# Patient Record
Sex: Male | Born: 1965 | Race: White | Hispanic: No | Marital: Married | Smoking: Never smoker
Health system: Southern US, Community
[De-identification: ages and names within clinical notes are randomized; demographics above are authoritative.]

## PROBLEM LIST (undated history)

## (undated) DIAGNOSIS — J45909 Unspecified asthma, uncomplicated: Secondary | ICD-10-CM

## (undated) DIAGNOSIS — I10 Essential (primary) hypertension: Secondary | ICD-10-CM

## (undated) HISTORY — DX: Unspecified asthma, uncomplicated: J45.909

## (undated) HISTORY — DX: Essential (primary) hypertension: I10

---

## 2003-07-20 HISTORY — PX: SINUSOTOMY: SHX291

## 2006-11-25 ENCOUNTER — Encounter: Admission: RE | Admit: 2006-11-25 | Discharge: 2006-11-25 | Payer: Self-pay | Admitting: Gastroenterology

## 2007-01-02 ENCOUNTER — Encounter: Admission: RE | Admit: 2007-01-02 | Discharge: 2007-01-02 | Payer: Self-pay | Admitting: Gastroenterology

## 2007-02-09 ENCOUNTER — Encounter: Admission: RE | Admit: 2007-02-09 | Discharge: 2007-02-09 | Payer: Self-pay | Admitting: Gastroenterology

## 2007-03-28 ENCOUNTER — Encounter (INDEPENDENT_AMBULATORY_CARE_PROVIDER_SITE_OTHER): Payer: Self-pay | Admitting: General Surgery

## 2007-03-28 ENCOUNTER — Ambulatory Visit (HOSPITAL_COMMUNITY): Admission: RE | Admit: 2007-03-28 | Discharge: 2007-03-28 | Payer: Self-pay | Admitting: General Surgery

## 2008-11-17 IMAGING — CT CT ABDOMEN W/ CM
2 of 5 series · 17 of 46 positions shown, 19 images · IV contrast (READICAT/WATER & [ID] OMNI 300)
Comparison: Ultrasound 11/25/06.

CLINICAL DATA: Abdominal pain.   Right upper quadrant pain.  Known gallstones.  Evaluate splenic cyst. 
 ABDOMEN CT WITH CONTRAST:
TECHNIQUE: Multidetector CT imaging of the abdomen was performed following the standard protocol during bolus administration of intravenous contrast.
 Contrast:  100 ml Omnipaque 300 IV
TECHNIQUE: Multidetector CT imaging of the pelvis was performed following the standard protocol during bolus administration of intravenous contrast.
 No comparison.

[Series 3: routine abdomen · axial · 0.68mm/px · z∈[-343,+62]mm · 14 of 87 slices shown, 16 images]
[im 5/87  soft-tissue]
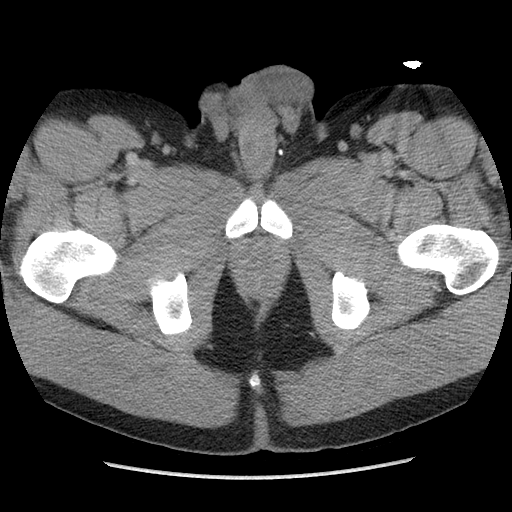
[im 5/87  bone]
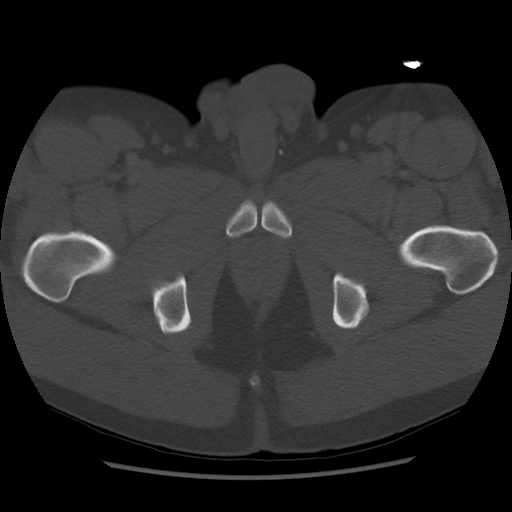
[im 10/87  soft-tissue]
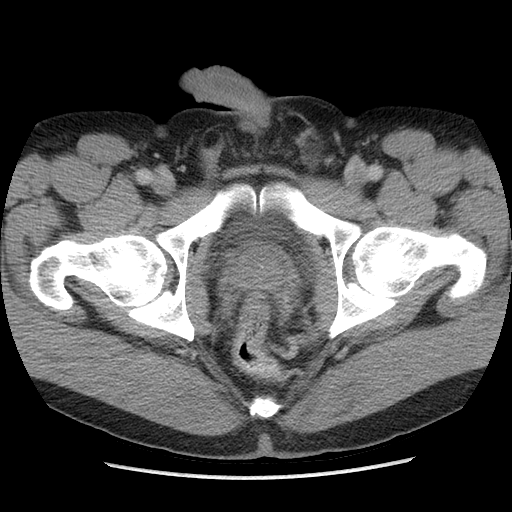
[im 20/87  soft-tissue]
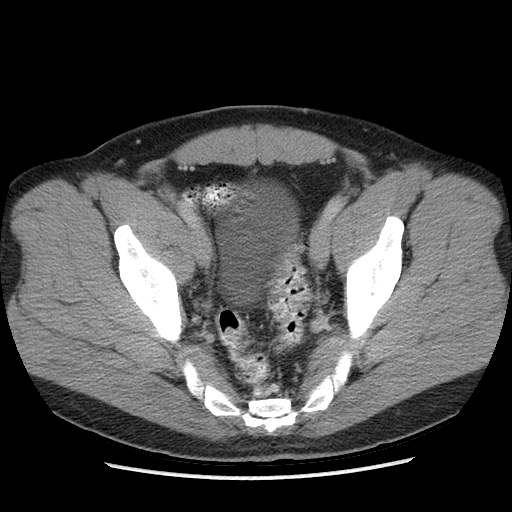
[im 24/87  soft-tissue]
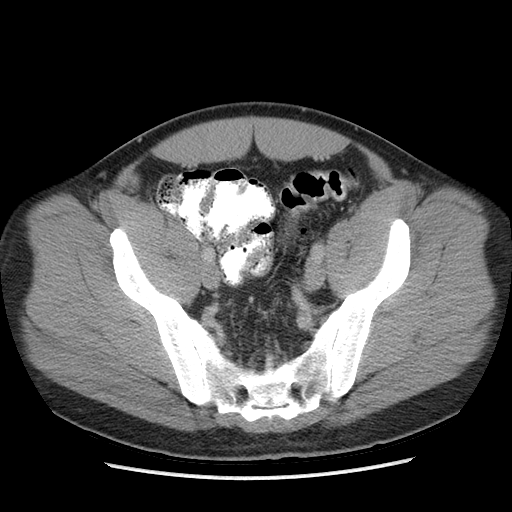
[im 29/87  soft-tissue]
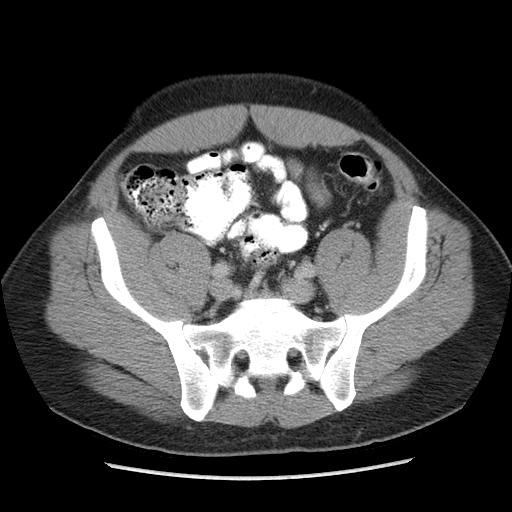
[im 34/87  soft-tissue]
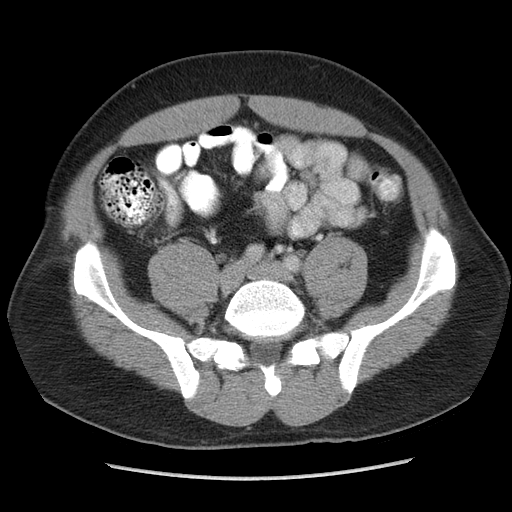
[im 39/87  soft-tissue]
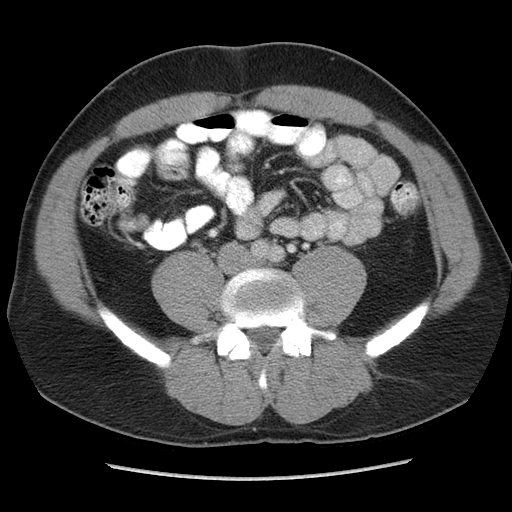
[im 48/87  soft-tissue]
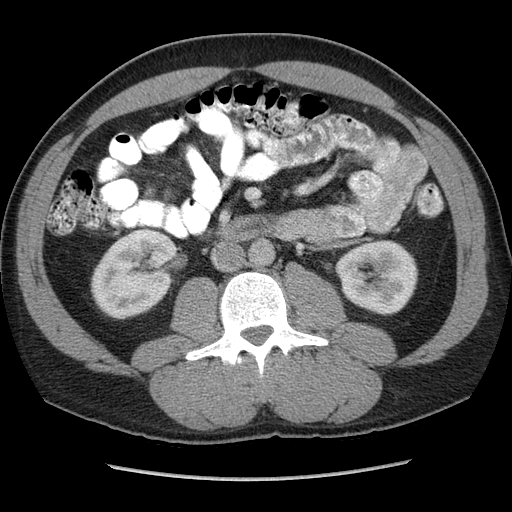
[im 53/87  soft-tissue]
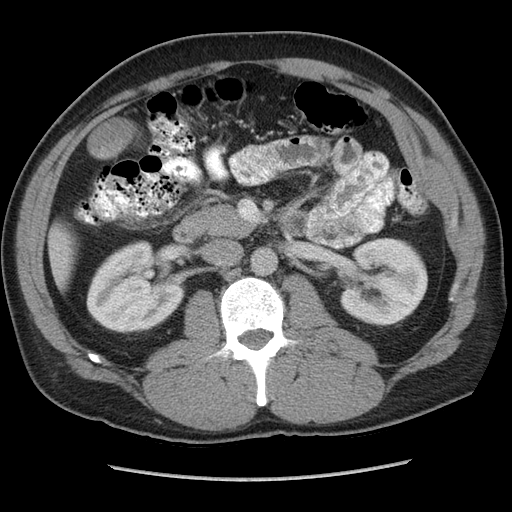
[im 53/87  bone]
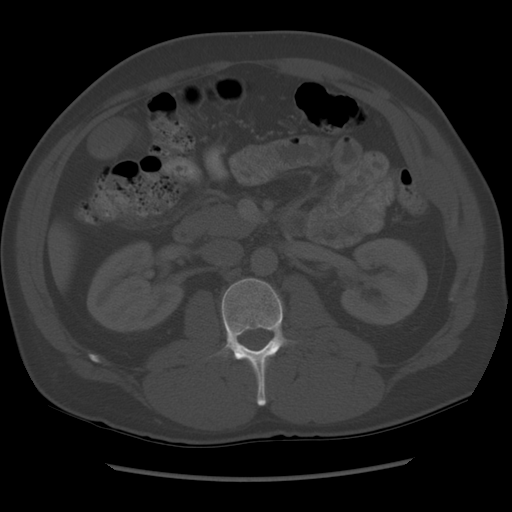
[im 58/87  soft-tissue]
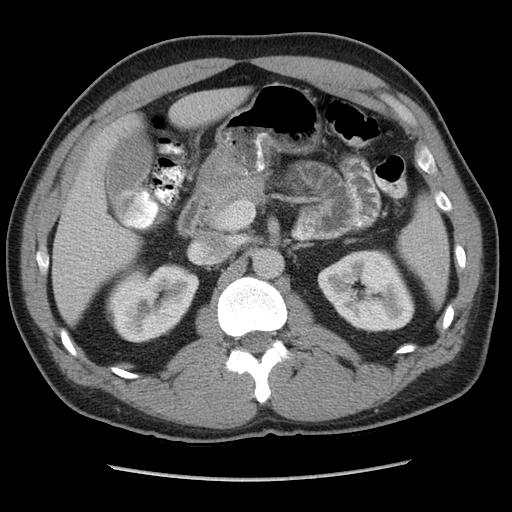
[im 63/87  soft-tissue]
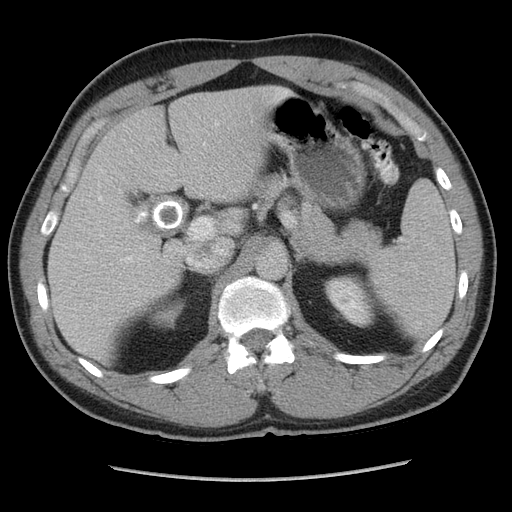
[im 67/87  soft-tissue]
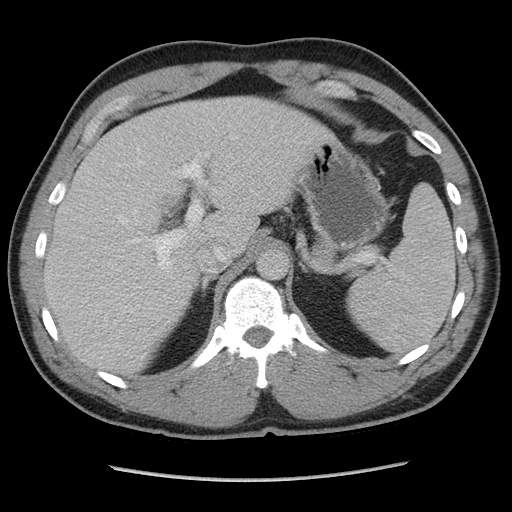
[im 77/87  soft-tissue]
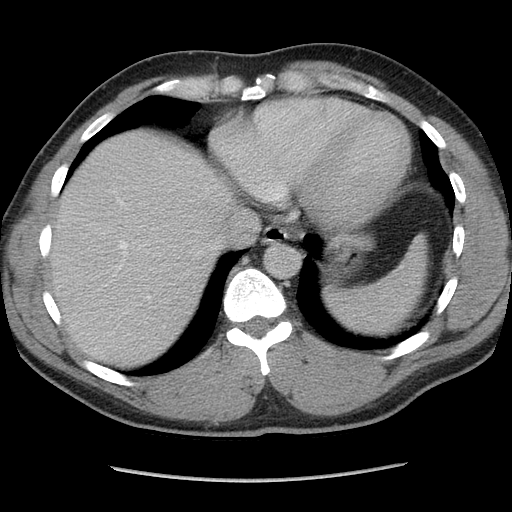
[im 82/87  soft-tissue]
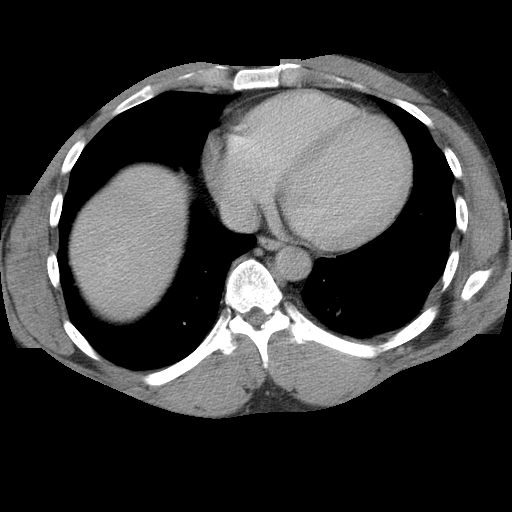

[Series 602: sagittal body · sagittal · 0.92mm/px · 3 of 141 slices shown]
[im 47/141  soft-tissue]
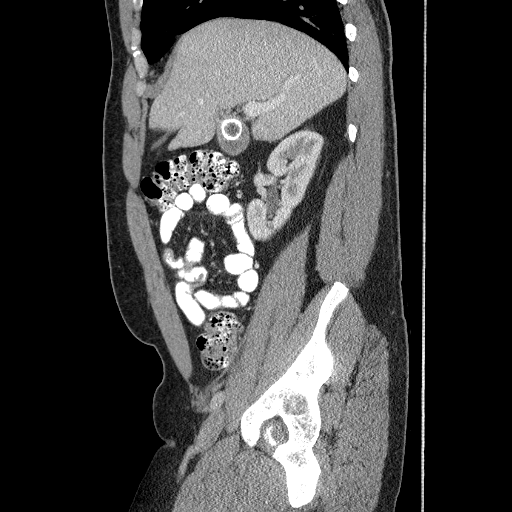
[im 63/141  soft-tissue]
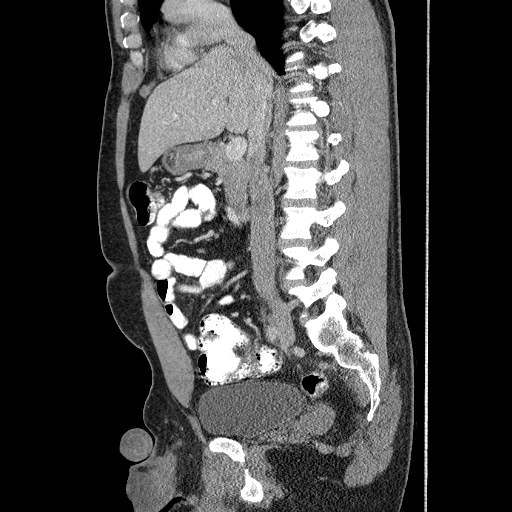
[im 78/141  soft-tissue]
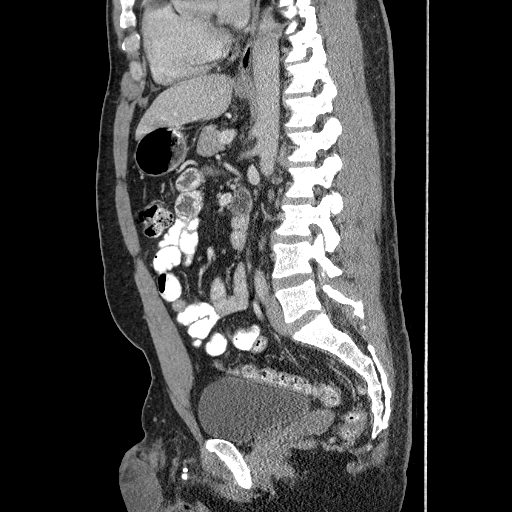

[17 of 46 positions shown; findings below may reference images not displayed]

FINDINGS: There are multiple calcified large gallstones.  The gallbladder wall is not thickened.  There is no biliary dilatation.  No liver lesions are identified.  There is a 13 mm complex cyst in the lower pole of the spleen as noted on an ultrasound.  This has water density, but an irregular wall on CT and ultrasound, and this may be a chronic complex benign appearing cyst.  The pancreas and kidneys are normal.  There is no adenopathy or mass.  The bowel is nondilated.
IMPRESSION: 1.  Multiple calcified large gallstones.
 2.  Benign appearing complex splenic cyst measuring 13 mm.  
 PELVIS CT WITH CONTRAST:
FINDINGS: Sigmoid diverticulosis is noted.  There is mild constipation.  The bowel is nondilated.  There is no free fluid or adenopathy.
IMPRESSION: Sigmoid diverticulosis.   No acute abnormality.

## 2009-01-01 ENCOUNTER — Encounter: Admission: RE | Admit: 2009-01-01 | Discharge: 2009-01-01 | Payer: Self-pay | Admitting: Family Medicine

## 2010-05-08 ENCOUNTER — Ambulatory Visit (HOSPITAL_BASED_OUTPATIENT_CLINIC_OR_DEPARTMENT_OTHER): Admission: RE | Admit: 2010-05-08 | Discharge: 2010-05-08 | Payer: Self-pay | Admitting: General Surgery

## 2010-07-19 HISTORY — PX: CHOLECYSTECTOMY: SHX55

## 2010-09-30 LAB — CBC
MCH: 31.3 pg (ref 26.0–34.0)
MCHC: 35.5 g/dL (ref 30.0–36.0)
MCV: 88.3 fL (ref 78.0–100.0)
Platelets: 238 10*3/uL (ref 150–400)
RBC: 5.04 MIL/uL (ref 4.22–5.81)

## 2010-09-30 LAB — POCT HEMOGLOBIN-HEMACUE: Hemoglobin: 15.5 g/dL (ref 13.0–17.0)

## 2010-09-30 LAB — DIFFERENTIAL
Basophils Relative: 0 % (ref 0–1)
Eosinophils Absolute: 0.1 10*3/uL (ref 0.0–0.7)
Eosinophils Relative: 2 % (ref 0–5)
Lymphs Abs: 2.3 10*3/uL (ref 0.7–4.0)
Monocytes Relative: 7 % (ref 3–12)

## 2010-09-30 LAB — BASIC METABOLIC PANEL
CO2: 30 mEq/L (ref 19–32)
Calcium: 9 mg/dL (ref 8.4–10.5)
Chloride: 103 mEq/L (ref 96–112)
Creatinine, Ser: 1.26 mg/dL (ref 0.4–1.5)
GFR calc Af Amer: >60 mL/min (ref >60)
Glucose, Bld: 100 mg/dL — ABNORMAL HIGH (ref 70–99)

## 2010-10-10 IMAGING — CR DG CHEST 2V
2 series · 2 of 2 positions shown · non-contrast
Comparison: None

CLINICAL DATA: Shortness of breath with exertion.

CHEST - 2 VIEW

[w chest pa]
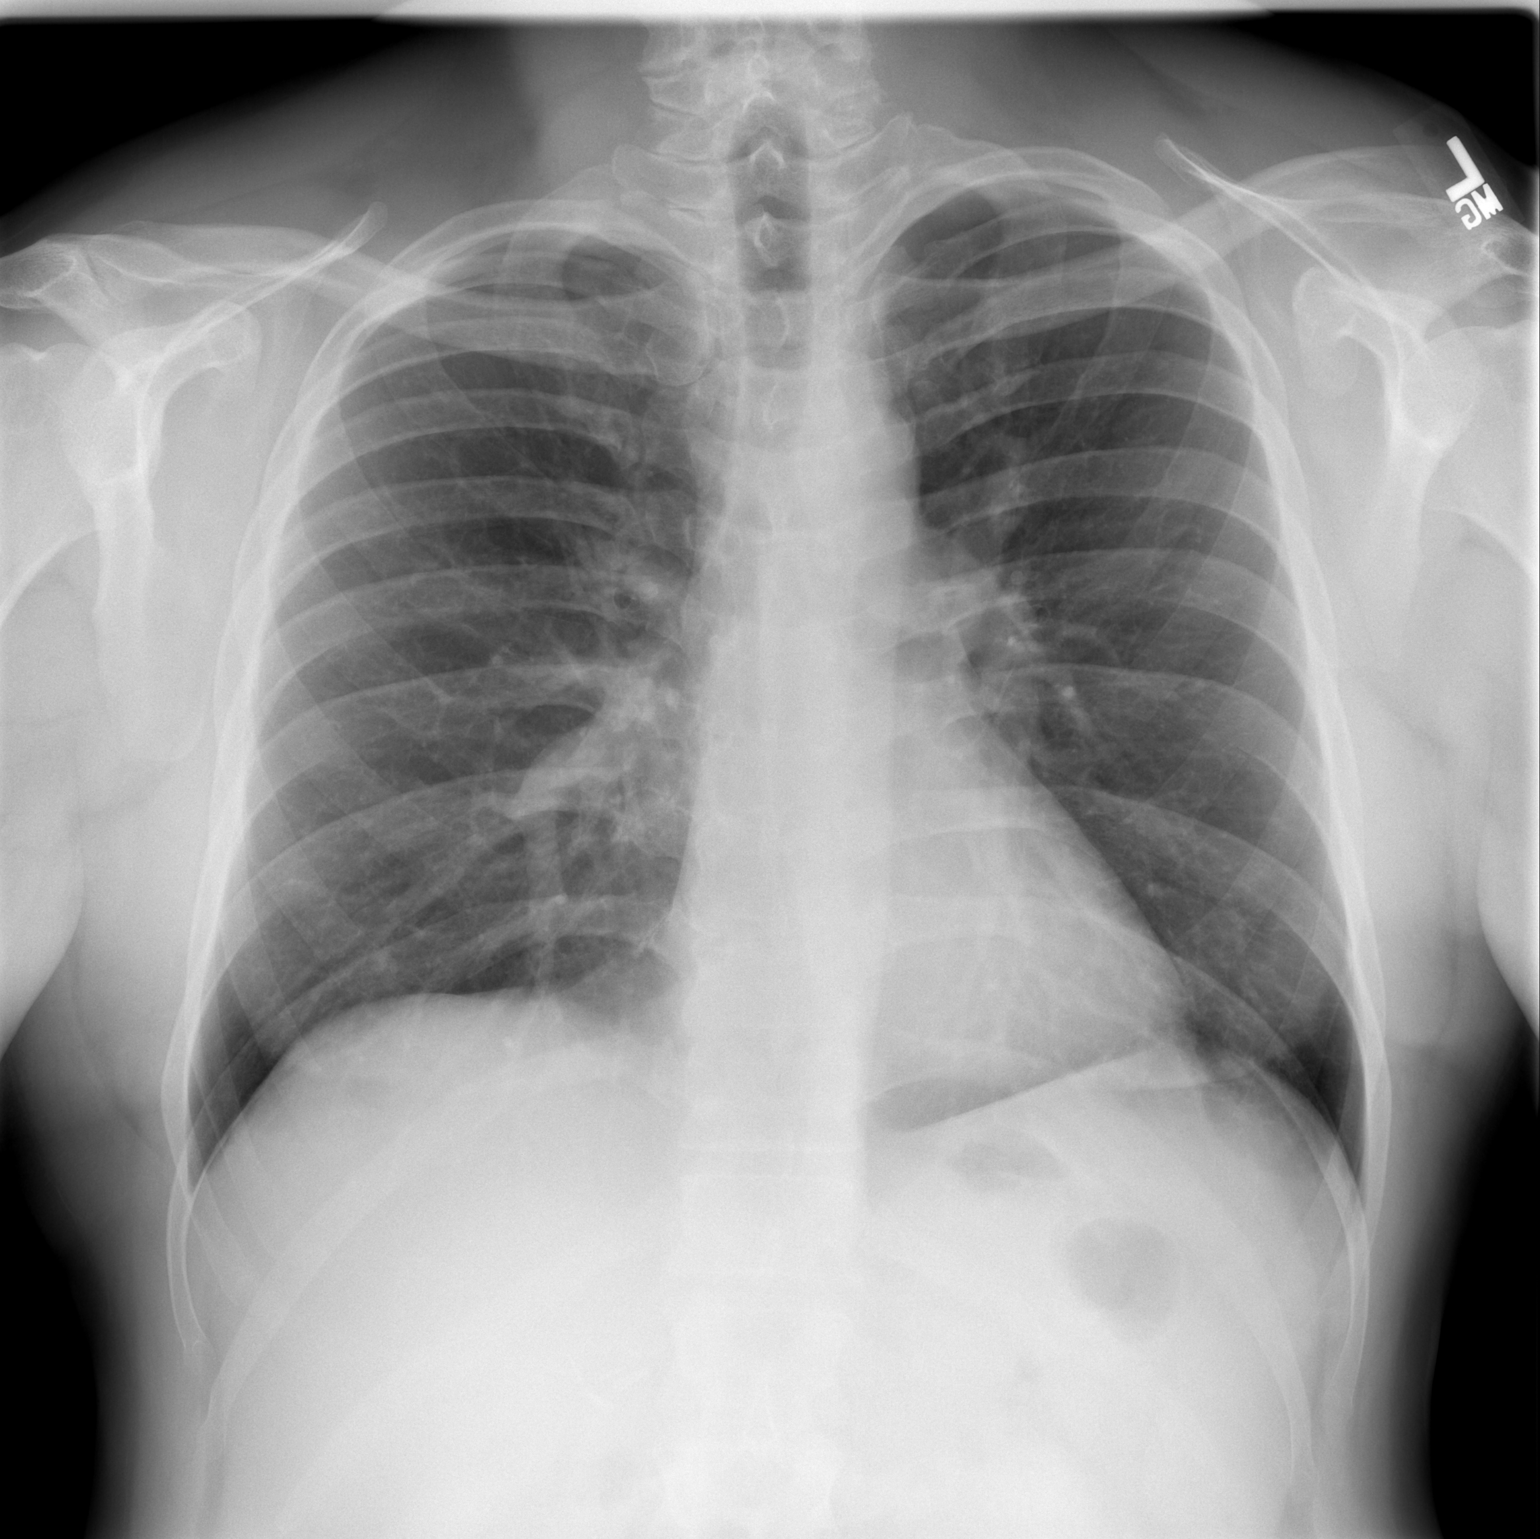

[w chest lat]
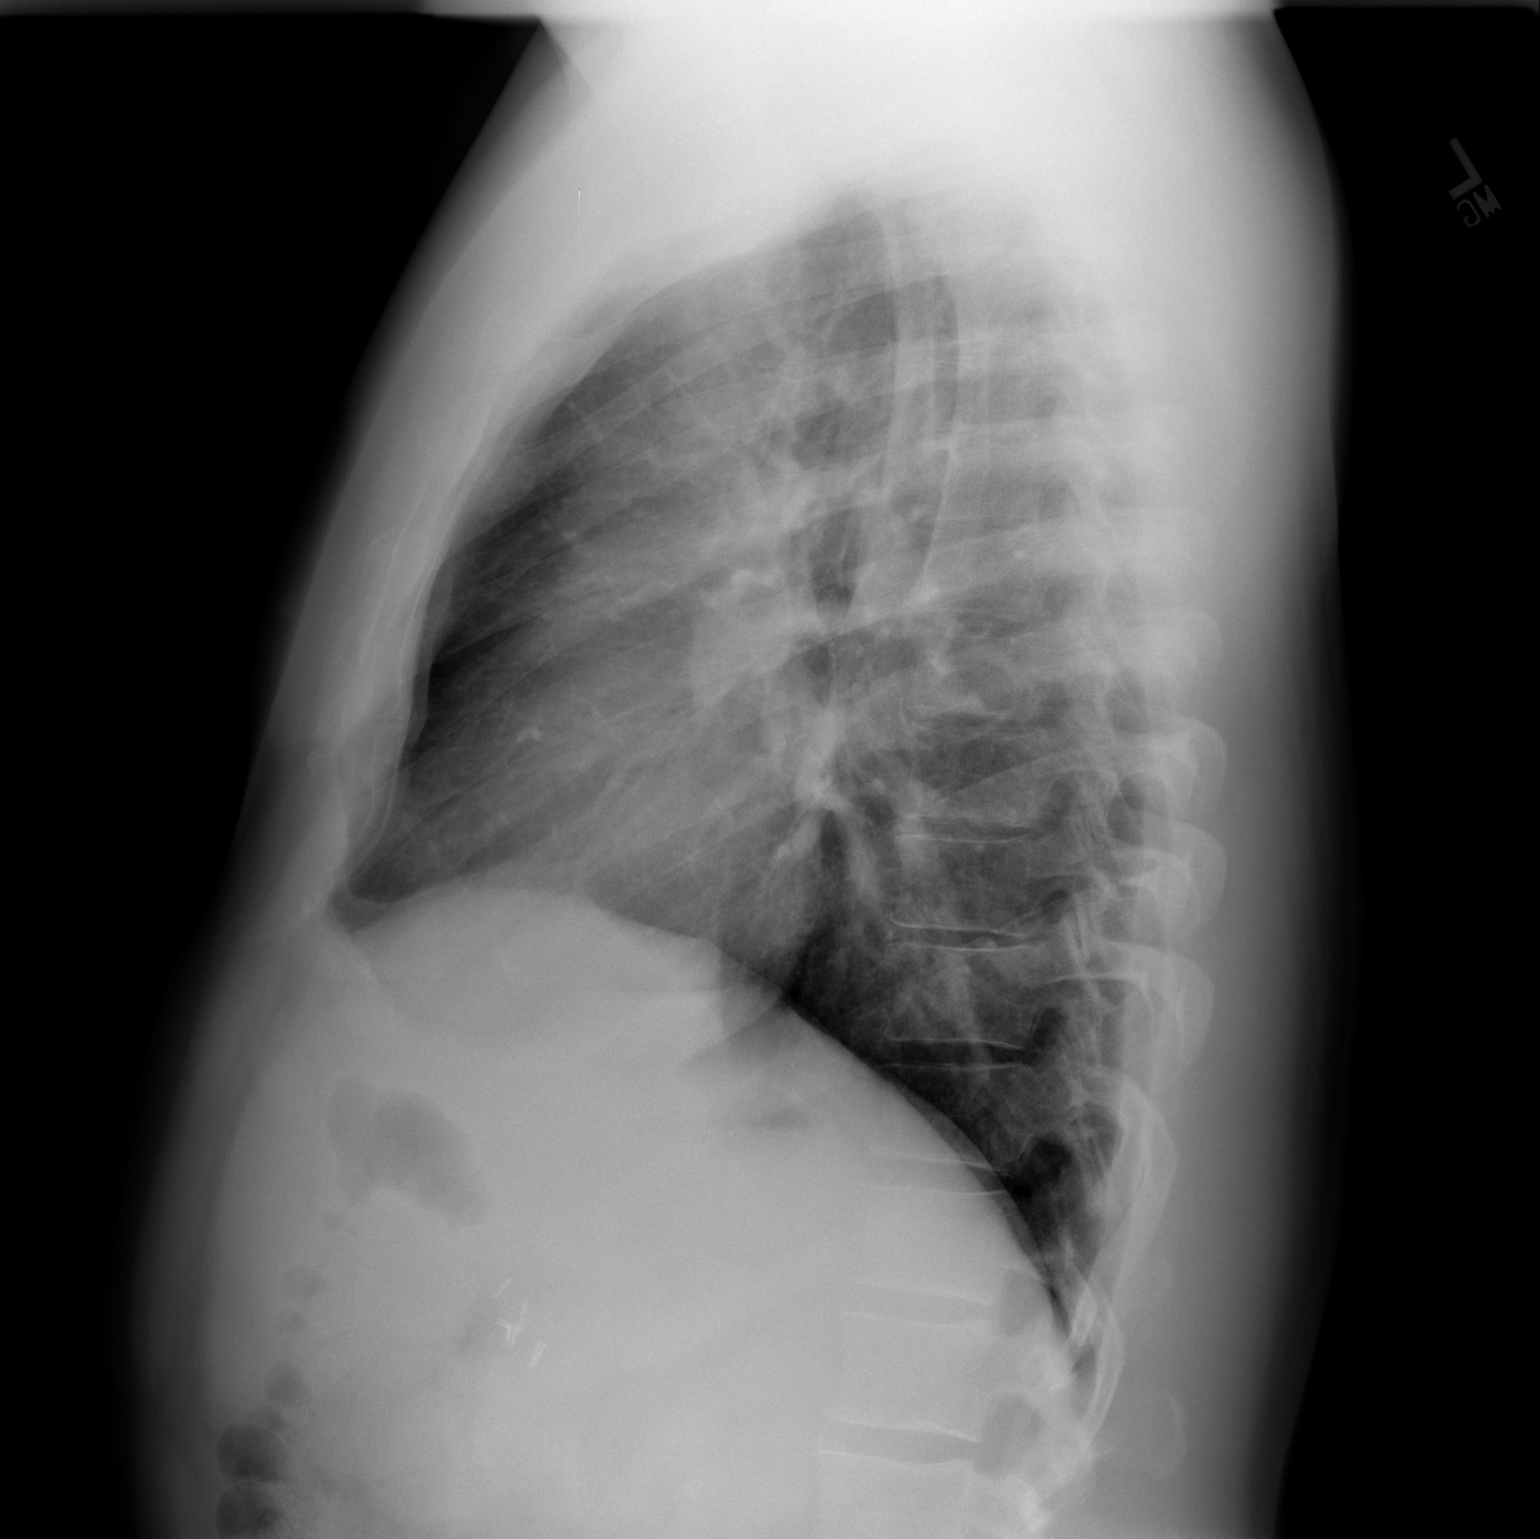

[2 of 2 positions shown; findings below may reference images not displayed]

FINDINGS: Trachea is midline.  Heart size normal.  Lungs are clear.
No pleural fluid.
IMPRESSION: No acute findings.

## 2010-12-01 NOTE — Op Note (Signed)
James Chandler, James Chandler            ACCOUNT NO.:  [id number]   MEDICAL RECORD NO.:  [id number]          PATIENT TYPE:  AMB   LOCATION:  SDS                          FACILITY:  [facility]   PHYSICIAN:  Cherylynn Ridges, M.D.    DATE OF BIRTH:  30-Mar-1966   DATE OF PROCEDURE:  03/28/2007  DATE OF DISCHARGE:                               OPERATIVE REPORT   PREOPERATIVE DIAGNOSIS:  Symptomatic large gallstones.   POSTOPERATIVE DIAGNOSIS:  Symptomatic large gallstones with subacute  cholecystitis.   PROCEDURE:  Laparoscopic cholecystectomy.   SURGEON:  Marta Lamas. Lindie Spruce, M.D.   ASSISTANT:  Leonie Man, M.D.   ANESTHESIA:  General endotracheal.   ESTIMATED BLOOD LOSS:  Less than 20 mL.   COMPLICATIONS:  None.   CONDITION:  Stable.   FINDINGS:  Two large gallstones in the gallbladder with a normal  cholangiogram and evidence of subacute inflammation.   INDICATIONS FOR OPERATION:  The patient is a 45 year old with abdominal  pain and enlarged gallstones noted on CT scan, who now comes in for a  laparoscopic cholecystectomy.   OPERATION:  The patient was taken to the operating room and placed on  the table in the supine position.  After an adequate endotracheal  anesthetic was administered, he was prepped and draped in the usual  sterile manner, exposing the midline and the right upper quadrant.   A supraumbilical curvilinear incision was made using a #11 blade, taken  down to the midline fascia.  We incised the midline fascia  longitudinally with a #15 blade, then grabbed the edges with Kocher  clamps.  As we tented up on the fascia, we bluntly dissected down into  the peritoneal cavity using Kelly clamp.  We then passed a pursestring  suture of 0 Vicryl around the fascia locally to secure in Hasson  cannula, which was subsequently passed into the peritoneal cavity.   Once the Hasson cannula was in place, we insufflated carbon dioxide gas  up to a maximal intraabdominal pressure of  50 mmHg.  With the  laparoscope and attached camera lights were inserted, we passed two  right costal margin 5-mm cannulas and a subxiphoid 11-12 mm cannula and  under direct vision into the peritoneal cavity.  The patient was placed  in reverse Trendelenburg.  The left side as tilted down.   The dome of the gallbladder was grasped using a ratcheted grasper and  retracted towards the right upper quadrant and the anterior abdominal  wall.  The second grasper was passed on to the infundibulum of the  gallbladder.  We then dissected out the peritoneum overlying the  triangle of Calot and the hepatoduodenal triangle.  This isolated the  cystic duct and the cystic artery.  We placed a clip along the  gallbladder side of the cystic duct.  We made a cholecystoductotomy  through which a Cook catheter which had been passed through the anterior  abdominal wall was inserted in order to perform the cholangiogram.  The  cholangiogram showed good flow into the duodenum, no filling defects, no  dilatation, and good proximal filling.   We removed the  clips securing the cholangiocatheter in place.  We placed  2 clips distally on the cystic duct, transecting the cystic duct,  clipped proximally and distally on the cystic artery transecting it.   We dissected out the gallbladder from its bed with minimal difficulty,  then used an EndoCatch bag to remove it from the supraumbilical fascia,  having to enlarge the fascia because of the large size of the stones.  Once this was done, we actually had to replace the pursestring suture in  order to close the fascia at this level using a running 0 Vicryl on a UR-  6 needle.  Once this was done, we inspected the hepatic bed and in the  right upper quadrant was minimal bleeding or bowel staining.  We  aspirated all fluid and gas from above the liver, then we closed the  peritoneum.   We removed all cannulas, closed the skin at all sites using running  subcuticular  stitch of 4-0 Vicryl with the exception of the lateral  cannula sites, which were closed with Dermabond.  Sterile dressings were  pressings were applied to all wounds. Marcaine 0.25% with epinephrine  was injected at all sites.      Cherylynn Ridges, M.D.  Electronically Signed     JOW/MEDQ  D:  03/28/2007  T:  03/28/2007  Job:  [id number]   cc:   Dr. Janey Greaser

## 2011-04-30 LAB — COMPREHENSIVE METABOLIC PANEL
Albumin: 4.1
BUN: 17
Creatinine, Ser: 1.14
GFR calc Af Amer: >60
Total Protein: 6.6

## 2011-04-30 LAB — CBC
HCT: 45.2
MCV: 90.1
Platelets: 273
RDW: 12.8

## 2011-04-30 LAB — DIFFERENTIAL
Lymphocytes Relative: 28
Lymphs Abs: 1.5
Monocytes Absolute: 0.4
Monocytes Relative: 7
Neutro Abs: 3.4

## 2013-07-19 HISTORY — PX: HERNIA REPAIR: SHX51

## 2015-04-15 ENCOUNTER — Other Ambulatory Visit: Payer: Self-pay | Admitting: Gastroenterology

## 2019-08-15 ENCOUNTER — Ambulatory Visit: Payer: Managed Care, Other (non HMO) | Attending: Internal Medicine

## 2019-08-15 DIAGNOSIS — Z20822 Contact with and (suspected) exposure to covid-19: Secondary | ICD-10-CM

## 2019-08-17 LAB — NOVEL CORONAVIRUS, NAA: SARS-CoV-2, NAA: NOT DETECTED

## 2019-09-22 ENCOUNTER — Ambulatory Visit: Payer: Managed Care, Other (non HMO) | Attending: Internal Medicine

## 2019-09-22 DIAGNOSIS — Z23 Encounter for immunization: Secondary | ICD-10-CM

## 2019-09-22 NOTE — Progress Notes (Signed)
   Covid-19 Vaccination Clinic  Name:  James Chandler    MRN: [id number] DOB: May 17, 1966  09/22/2019  [name] was observed post Covid-19 immunization for 15 minutes without incident. He was provided with Vaccine Information Sheet and instruction to access the V-Safe system.   [name] was instructed to call 911 with any severe reactions post vaccine: Marland Kitchen Difficulty breathing  . Swelling of face and throat  . A fast heartbeat  . A bad rash all over body  . Dizziness and weakness   Immunizations Administered    Name Date Dose VIS Date Route   Pfizer COVID-19 Vaccine 09/22/2019 10:04 AM 0.3 mL 06/29/2019 Intramuscular   Manufacturer: [employer], [employer]   Lot: [id number]   NDC: [address]-8

## 2019-10-13 ENCOUNTER — Ambulatory Visit: Payer: Managed Care, Other (non HMO) | Attending: Internal Medicine

## 2019-10-13 DIAGNOSIS — Z23 Encounter for immunization: Secondary | ICD-10-CM

## 2019-10-13 NOTE — Progress Notes (Signed)
   Covid-19 Vaccination Clinic  Name:  James Chandler    MRN: [id number] DOB: May 12, 1966  10/13/2019  [name] was observed post Covid-19 immunization for 30 minutes based on pre-vaccination screening without incident. He was provided with Vaccine Information Sheet and instruction to access the V-Safe system.   [name] was instructed to call 911 with any severe reactions post vaccine: Marland Kitchen Difficulty breathing  . Swelling of face and throat  . A fast heartbeat  . A bad rash all over body  . Dizziness and weakness   Immunizations Administered    Name Date Dose VIS Date Route   Pfizer COVID-19 Vaccine 10/13/2019  8:28 AM 0.3 mL 06/29/2019 Intramuscular   Manufacturer: [employer], [employer]   Lot: [id number]   NDC: [address]-2

## 2020-05-15 ENCOUNTER — Ambulatory Visit: Payer: Managed Care, Other (non HMO) | Attending: Internal Medicine

## 2020-05-15 DIAGNOSIS — Z23 Encounter for immunization: Secondary | ICD-10-CM

## 2020-05-15 NOTE — Progress Notes (Signed)
   Covid-19 Vaccination Clinic  Name:  JUANA MONTINI    MRN: [id number] DOB: 09-Jul-1966  05/15/2020  Mr. Hosman was observed post Covid-19 immunization for 15 minutes without incident. He was provided with Vaccine Information Sheet and instruction to access the V-Safe system.   Mr. Kerlin was instructed to call 911 with any severe reactions post vaccine: Marland Kitchen Difficulty breathing  . Swelling of face and throat  . A fast heartbeat  . A bad rash all over body  . Dizziness and weakness

## 2021-10-05 ENCOUNTER — Other Ambulatory Visit: Payer: Self-pay | Admitting: Family Medicine

## 2021-10-05 DIAGNOSIS — R1032 Left lower quadrant pain: Secondary | ICD-10-CM

## 2021-10-06 ENCOUNTER — Other Ambulatory Visit: Payer: Self-pay

## 2021-10-06 ENCOUNTER — Ambulatory Visit
Admission: RE | Admit: 2021-10-06 | Discharge: 2021-10-06 | Disposition: A | Discharge status: Home / Self Care | Payer: BC Managed Care – PPO | Source: Ambulatory Visit | Attending: Family Medicine | Admitting: Family Medicine

## 2021-10-06 DIAGNOSIS — R1032 Left lower quadrant pain: Secondary | ICD-10-CM

## 2021-10-06 MED ORDER — IOPAMIDOL (ISOVUE-300) INJECTION 61%
100.0000 mL | Freq: Once | INTRAVENOUS | Status: AC | PRN
  Administered 2021-10-06: 100 mL via INTRAVENOUS

## 2021-11-29 NOTE — Progress Notes (Signed)
? ?Primary Physician/Referring:  Emeterio Reeve, MD ? ?Patient ID: James Chandler, male    DOB: 28-Oct-1965, 56 y.o.   MRN: [id number] ? ?Chief Complaint  ?Patient presents with  ? Aortic Valve Insuffiency  ? New Patient (Initial Visit)  ?  Referred by Kathrynn Speed  ? ?HPI:   ? ?James Chandler  is a 56 y.o. Caucasian male patient with hypertension, hypogonadism presently on testosterone supplements, referred to me for evaluation of new onset heart murmur and suspicion for aortic insufficiency.  Patient is essentially asymptomatic, exercises on a regular basis in the [facility].  He is accompanied by his wife today. ? ?I had taken care of of his brother and I had performed PFO closure years ago.  No specific symptoms, wife states that he snores very loudly through the night.  He has occasional episodes of daytime drowsiness. ? ?Past Medical History:  ?Diagnosis Date  ? Asthma   ? Hypertension   ? ?Past Surgical History:  ?Procedure Laterality Date  ? CHOLECYSTECTOMY  2012  ? HERNIA REPAIR Bilateral 2015  ? SINUSOTOMY  2005  ? ?Family History  ?Problem Relation Age of Onset  ? Hypertension Mother   ? Stroke Mother 79  ? Atrial fibrillation Father   ? Atrial fibrillation Brother   ? Diverticulitis Brother   ? Other Brother   ?     PFO  ?  ?Social History  ? ?Tobacco Use  ? Smoking status: Never  ? Smokeless tobacco: Former  ?  Types: Chew  ?  Quit date: 75  ?Substance Use Topics  ? Alcohol use: Yes  ?  Alcohol/week: 1.0 standard drink  ?  Types: 1 Glasses of wine per week  ?  Comment: occasionally  ? ?Marital Status: Married  ?ROS  ?Review of Systems  ?Cardiovascular:  Negative for chest pain, dyspnea on exertion and leg swelling.  ?Respiratory:  Positive for snoring.   ?Objective  ?Blood pressure 130/63, pulse (!) 59, temperature 98.7 ?F (37.1 ?C), temperature source Temporal, resp. rate 16, height 6' (1.829 m), weight 227 lb 3.2 oz (103.1 kg), SpO2 97 %. Body mass index is 30.81 kg/m?.  ? ?   11/30/2021  ?  9:40 AM  ?Vitals with BMI  ?Height _0   ?Weight 227 lbs 3 oz  ?BMI 30.81  ?Systolic 650  ?Diastolic 63  ?Pulse 59  ?  ?Physical Exam ?Constitutional:   ?   Appearance: He is well-developed.  ?Neck:  ?   Vascular: No JVD.  ?Cardiovascular:  ?   Rate and Rhythm: Normal rate and regular rhythm.  ?   Pulses: Intact distal pulses.  ?   Heart sounds: Murmur heard.  ?High-pitched blowing decrescendo early diastolic murmur is present with a grade of 3/4 at the upper right sternal border radiating to the apex.  ?  No gallop.  ?Pulmonary:  ?   Effort: Pulmonary effort is normal.  ?   Breath sounds: Normal breath sounds.  ?Abdominal:  ?   General: Bowel sounds are normal.  ?   Palpations: Abdomen is soft.  ?Musculoskeletal:  ?   Right lower leg: No edema.  ?   Left lower leg: No edema.  ? ? ?Medications and allergies  ? ?Allergies  ?Allergen Reactions  ? Penicillins Rash  ?  Other reaction(s): Unknown  ?  ? ?Medication list after today's encounter  ? ?Current Outpatient Medications:  ?  albuterol (VENTOLIN HFA) 108 (90 Base) MCG/ACT inhaler, Inhale  2 puffs into the lungs every 4 (four) hours as needed., Disp: , Rfl:  ?  levocetirizine (XYZAL) 5 MG tablet, Take 1 tablet by mouth at bedtime., Disp: , Rfl:  ?  lisinopril (ZESTRIL) 20 MG tablet, Take 20 mg by mouth daily., Disp: , Rfl:  ?  montelukast (SINGULAIR) 10 MG tablet, Take 1 tablet by mouth daily., Disp: , Rfl:  ?  tadalafil (CIALIS) 5 MG tablet, Take 5 mg by mouth daily., Disp: , Rfl:  ?  testosterone cypionate (DEPOTESTOSTERONE CYPIONATE) 200 MG/ML injection, Inject 1 mL into the skin every 14 (fourteen) days., Disp: , Rfl:  ? ?Laboratory examination:  ? ?External labs:  ? ?Labs 11/11/2021: ? ?TSH normal at 2.140.  Vitamin D 56. ? ?Total cholesterol 208, triglycerides 115, HDL 58, LDL 127.  Non-HDL cholesterol 150. ? ?Hb 17.7/HCT 50.5, platelets 183.  Normal indicis. ? ?121, creatinine 1.11, EGFR >90 mL.  Potassium 4.4.  LFTs normal. ? ?Radiology:   ? ? ?Cardiac Studies:  ?NA ? ?EKG:  ? ?EKG 11/30/2021: Normal sinus rhythm at rate of 58 bpm, normal axis, no evidence of ischemia, normal EKG.   ? ?Assessment  ? ?  ICD-10-CM   ?1. Primary hypertension  I10 CT CARDIAC SCORING ([facility] LOCATIONS ONLY)  ?  ?2. Nonrheumatic aortic valve insufficiency  I35.1 EKG 12-Lead  ?  CT CARDIAC SCORING ([facility] LOCATIONS ONLY)  ?  PCV ECHOCARDIOGRAM COMPLETE  ?  ?3. Loud snoring  R06.83 Ambulatory referral to Sleep Studies  ?  ?4. Daytime sleepiness  R40.0 Ambulatory referral to Sleep Studies  ?  ?  ? ?There are no discontinued medications.  ?No orders of the defined types were placed in this encounter. ? ?Orders Placed This Encounter  ?Procedures  ? CT CARDIAC SCORING ([facility] LOCATIONS ONLY)  ?  Standing Status:   Future  ?  Standing Expiration Date:   01/30/2022  ?  Order Specific Question:   Preferred imaging location?  ?  Answer:   [facility]  ? Ambulatory referral to Sleep Studies  ?  Referral Priority:   Routine  ?  Referral Type:   Consultation  ?  Referral Reason:   Specialty Services Required  ?  Referred to Provider:   Marius Ditch, MD  ?  Number of Visits Requested:   1  ? EKG 12-Lead  ? PCV ECHOCARDIOGRAM COMPLETE  ?  Standing Status:   Future  ?  Standing Expiration Date:   12/01/2022  ? ?Recommendations:  ? ?James Chandler is a 56 y.o.  Patient is essentially asymptomatic, exercises on a regular basis in the [facility].  He is accompanied by his wife today. No specific symptoms, wife states that he snores very loudly through the night.  He has occasional episodes of daytime drowsiness. ? ?His physical examination is consistent with at least a moderate aortic regurgitation.  I will schedule him for echocardiogram.  He does not need any stress testing as he is asymptomatic and exercises regularly in [facility].  I will also schedule him for coronary calcium score as he does have mild hyperlipidemia. ? ?He snores on a daily basis fairly loudly, has occasional episodes of  daytime sleepiness, will refer him for sleep study to Dr. Nehemiah Settle.  I will see him back in 4 weeks for follow-up.  No changes in the medications were done today.  Blood pressure is well controlled. ? ?He does take testosterone supplements, his hemoglobin was elevated however patient has given blood  donations on a regular basis and this was pre-blood donation. ? ? ?Adrian Prows, MD, [facility] ?11/30/2021, 10:49 AM ?Office: [phone] ?

## 2021-11-30 ENCOUNTER — Ambulatory Visit: Payer: BC Managed Care – PPO | Admitting: Cardiology

## 2021-11-30 ENCOUNTER — Encounter: Payer: Self-pay | Admitting: Cardiology

## 2021-11-30 VITALS — BP 130/63 | HR 59 | Temp 98.7°F | Resp 16 | Ht 72.0 in | Wt 227.2 lb

## 2021-11-30 DIAGNOSIS — I351 Nonrheumatic aortic (valve) insufficiency: Secondary | ICD-10-CM

## 2021-11-30 DIAGNOSIS — R400 Somnolence: Secondary | ICD-10-CM

## 2021-11-30 DIAGNOSIS — R0683 Snoring: Secondary | ICD-10-CM

## 2021-11-30 DIAGNOSIS — I10 Essential (primary) hypertension: Secondary | ICD-10-CM

## 2021-12-10 ENCOUNTER — Ambulatory Visit: Payer: BC Managed Care – PPO

## 2021-12-10 DIAGNOSIS — I351 Nonrheumatic aortic (valve) insufficiency: Secondary | ICD-10-CM

## 2022-01-11 ENCOUNTER — Ambulatory Visit: Payer: BC Managed Care – PPO | Admitting: Cardiology

## 2022-01-11 NOTE — Progress Notes (Deleted)
Primary Physician/Referring:  Delia Heady, MD  Patient ID: James Chandler, male    DOB: 30-Mar-1966, 56 y.o.   MRN: [id number]  No chief complaint on file.  HPI:    James Chandler  is a 56 y.o. Caucasian male patient with hypertension, hypogonadism presently on testosterone supplements, referred to me for evaluation of new onset heart murmur and suspicion for aortic insufficiency.  Patient is essentially asymptomatic, exercises on a regular basis in the [facility].  He is accompanied by his wife today.  I had taken care of of his brother and I had performed PFO closure years ago.  No specific symptoms, wife states that he snores very loudly through the night.  He has occasional episodes of daytime drowsiness.  Past Medical History:  Diagnosis Date   Asthma    Hypertension    Past Surgical History:  Procedure Laterality Date   CHOLECYSTECTOMY  2012   HERNIA REPAIR Bilateral 2015   SINUSOTOMY  2005   Family History  Problem Relation Age of Onset   Hypertension Mother    Stroke Mother 67   Atrial fibrillation Father    Atrial fibrillation Brother    Diverticulitis Brother    Other Brother        PFO    Social History   Tobacco Use   Smoking status: Never   Smokeless tobacco: Former    Types: Chew    Quit date: 1985  Substance Use Topics   Alcohol use: Yes    Alcohol/week: 1.0 standard drink of alcohol    Types: 1 Glasses of wine per week    Comment: occasionally   Marital Status: Married  ROS  Review of Systems  Cardiovascular:  Negative for chest pain, dyspnea on exertion and leg swelling.  Respiratory:  Positive for snoring.    Objective  There were no vitals taken for this visit. There is no height or weight on file to calculate BMI.     11/30/2021    9:40 AM  Vitals with BMI  Height 6\' 0"   Weight 227 lbs 3 oz  BMI 30.81  Systolic 130  Diastolic 63  Pulse 59    Physical Exam Constitutional:      Appearance: He is well-developed.  Neck:      Vascular: No JVD.  Cardiovascular:     Rate and Rhythm: Normal rate and regular rhythm.     Pulses: Intact distal pulses.     Heart sounds: Murmur heard.     High-pitched blowing decrescendo early diastolic murmur is present with a grade of 3/4 at the upper right sternal border radiating to the apex.     No gallop.  Pulmonary:     Effort: Pulmonary effort is normal.     Breath sounds: Normal breath sounds.  Abdominal:     General: Bowel sounds are normal.     Palpations: Abdomen is soft.  Musculoskeletal:     Right lower leg: No edema.     Left lower leg: No edema.    Medications and allergies   Allergies  Allergen Reactions   Penicillins Rash    Other reaction(s): Unknown     Medication list after today's encounter   Current Outpatient Medications:    albuterol (VENTOLIN HFA) 108 (90 Base) MCG/ACT inhaler, Inhale 2 puffs into the lungs every 4 (four) hours as needed., Disp: , Rfl:    levocetirizine (XYZAL) 5 MG tablet, Take 1 tablet by mouth at bedtime., Disp: , Rfl:  lisinopril (ZESTRIL) 20 MG tablet, Take 20 mg by mouth daily., Disp: , Rfl:    montelukast (SINGULAIR) 10 MG tablet, Take 1 tablet by mouth daily., Disp: , Rfl:    tadalafil (CIALIS) 5 MG tablet, Take 5 mg by mouth daily., Disp: , Rfl:    testosterone cypionate (DEPOTESTOSTERONE CYPIONATE) 200 MG/ML injection, Inject 1 mL into the skin every 14 (fourteen) days., Disp: , Rfl:   Laboratory examination:   External labs:   Labs 11/11/2021:  TSH normal at 2.140.  Vitamin D 56.  Total cholesterol 208, triglycerides 115, HDL 58, LDL 127.  Non-HDL cholesterol 150.  Hb 17.7/HCT 50.5, platelets 183.  Normal indicis.  121, creatinine 1.11, EGFR >90 mL.  Potassium 4.4.  LFTs normal.  Radiology:    Cardiac Studies:   PCV ECHOCARDIOGRAM COMPLETE 12/10/2021  Narrative Echocardiogram 12/10/2021: Left ventricle cavity is moderately dilated at 6.46 cm. Normal wall thickness. Normal global wall motion.  Normal LV systolic function with EF 59%. Normal diastolic filling pattern. The aortic root is dilated. measuring 3.9-4.0 cm at sinotubular junction and proximal ascending aorta. Trileaflet aortic valve.  Non coronary cusp appears smaller. Eccentric moderate aortic regurgitation. Mild (Grade I) mitral regurgitation. Normal right atrial pressure.     EKG:   EKG 11/30/2021: Normal sinus rhythm at rate of 58 bpm, normal axis, no evidence of ischemia, normal EKG.    Assessment     ICD-10-CM   1. Nonrheumatic aortic valve insufficiency  I35.1     2. Cardiomegaly  I51.7     3. Primary hypertension  I10        There are no discontinued medications.  No orders of the defined types were placed in this encounter.  No orders of the defined types were placed in this encounter.  Recommendations:   James Chandler is a 56 y.o.  Patient is essentially asymptomatic, exercises on a regular basis in the [facility].  He is accompanied by his wife today. No specific symptoms, wife states that he snores very loudly through the night.  He has occasional episodes of daytime drowsiness.  His physical examination is consistent with at least a moderate aortic regurgitation.  I will schedule him for echocardiogram.  He does not need any stress testing as he is asymptomatic and exercises regularly in [facility].  I will also schedule him for coronary calcium score as he does have mild hyperlipidemia.  He snores on a daily basis fairly loudly, has occasional episodes of daytime sleepiness, will refer him for sleep study to Dr. Nehemiah Settle.  I will see him back in 4 weeks for follow-up.  No changes in the medications were done today.  Blood pressure is well controlled.  He does take testosterone supplements, his hemoglobin was elevated however patient has given blood donations on a regular basis and this was pre-blood donation.   Adrian Prows, MD, [facility] 01/11/2022, 1:10 PM Office: [phone]

## 2022-01-18 ENCOUNTER — Ambulatory Visit
Admission: RE | Admit: 2022-01-18 | Discharge: 2022-01-18 | Disposition: A | Discharge status: Home / Self Care | Payer: No Typology Code available for payment source | Source: Ambulatory Visit | Attending: Cardiology | Admitting: Cardiology

## 2022-01-18 DIAGNOSIS — I10 Essential (primary) hypertension: Secondary | ICD-10-CM

## 2022-01-18 DIAGNOSIS — I351 Nonrheumatic aortic (valve) insufficiency: Secondary | ICD-10-CM

## 2022-01-19 NOTE — Progress Notes (Signed)
Coronary calcium score 01/18/2022: LM 0 LAD 10.9 LCx 0 RCA 0 Total Agatston score 10.9.  MESA database percentile: 48. Ascending and descending thoracic aortic measurements are normal.  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

## 2022-01-27 ENCOUNTER — Ambulatory Visit: Payer: BC Managed Care – PPO | Admitting: Cardiology

## 2022-01-27 ENCOUNTER — Encounter: Payer: Self-pay | Admitting: Cardiology

## 2022-01-27 VITALS — BP 136/77 | HR 64 | Temp 98.3°F | Resp 17 | Ht 72.0 in | Wt 220.8 lb

## 2022-01-27 DIAGNOSIS — I351 Nonrheumatic aortic (valve) insufficiency: Secondary | ICD-10-CM

## 2022-01-27 DIAGNOSIS — E7800 Pure hypercholesterolemia, unspecified: Secondary | ICD-10-CM

## 2022-01-27 DIAGNOSIS — I10 Essential (primary) hypertension: Secondary | ICD-10-CM

## 2022-01-27 MED ORDER — ATORVASTATIN CALCIUM 10 MG PO TABS: 10.0000 mg | ORAL_TABLET | Freq: Every day | ORAL | 2 refills | Status: DC

## 2022-01-27 NOTE — Progress Notes (Signed)
Primary Physician/Referring:  Delia Heady, MD  Patient ID: James Chandler, male    DOB: September 12, 1965, 56 y.o.   MRN: [id number]  Chief Complaint  Patient presents with   AORTIC REGURGITATION    4 WEEKS   HPI:    James Chandler  is a 56 y.o. Caucasian male patient with hypertension, presents here for follow-up of valvular heart disease, otherwise asymptomatic.  He exercises regularly.   He is accompanied by his wife today.     Past Medical History:  Diagnosis Date   Asthma    Hypertension    Past Surgical History:  Procedure Laterality Date   CHOLECYSTECTOMY  2012   HERNIA REPAIR Bilateral 2015   SINUSOTOMY  2005   Family History  Problem Relation Age of Onset   Hypertension Mother    Stroke Mother 67   Atrial fibrillation Father    Atrial fibrillation Brother    Diverticulitis Brother    Other Brother        PFO    Social History   Tobacco Use   Smoking status: Never   Smokeless tobacco: Former    Types: Chew    Quit date: 1985  Substance Use Topics   Alcohol use: Yes    Alcohol/week: 1.0 standard drink of alcohol    Types: 1 Glasses of wine per week    Comment: occasionally   Marital Status: Married  ROS  Review of Systems  Cardiovascular:  Negative for chest pain, dyspnea on exertion and leg swelling.  Respiratory:  Positive for snoring.    Objective  Blood pressure 136/77, pulse 64, temperature 98.3 F (36.8 C), temperature source Temporal, resp. rate 17, height 6' (1.829 m), weight 220 lb 12.8 oz (100.2 kg), SpO2 98 %. Body mass index is 29.95 kg/m.     01/27/2022    3:43 PM 11/30/2021    9:40 AM  Vitals with BMI  Height 6\' 0"  6\' 0"   Weight 220 lbs 13 oz 227 lbs 3 oz  BMI 29.94 30.81  Systolic 136 130  Diastolic 77 63  Pulse 64 59    Physical Exam Constitutional:      Appearance: He is well-developed.  Neck:     Vascular: No JVD.  Cardiovascular:     Rate and Rhythm: Normal rate and regular rhythm.     Pulses: Intact distal  pulses.     Heart sounds: Murmur heard.     High-pitched blowing decrescendo early diastolic murmur is present with a grade of 3/4 at the upper right sternal border radiating to the apex.     No gallop.  Pulmonary:     Effort: Pulmonary effort is normal.     Breath sounds: Normal breath sounds.  Abdominal:     General: Bowel sounds are normal.     Palpations: Abdomen is soft.  Musculoskeletal:     Right lower leg: No edema.     Left lower leg: No edema.    Medications and allergies   Allergies  Allergen Reactions   Penicillins Rash    Other reaction(s): Unknown     Medication list after today's encounter   Current Outpatient Medications:    albuterol (VENTOLIN HFA) 108 (90 Base) MCG/ACT inhaler, Inhale 2 puffs into the lungs every 4 (four) hours as needed., Disp: , Rfl:    atorvastatin (LIPITOR) 10 MG tablet, Take 1 tablet (10 mg total) by mouth daily., Disp: 30 tablet, Rfl: 2   levocetirizine (XYZAL) 5 MG tablet,  Take 1 tablet by mouth at bedtime., Disp: , Rfl:    lisinopril (ZESTRIL) 20 MG tablet, Take 20 mg by mouth daily., Disp: , Rfl:    montelukast (SINGULAIR) 10 MG tablet, Take 1 tablet by mouth daily., Disp: , Rfl:    tadalafil (CIALIS) 5 MG tablet, Take 5 mg by mouth daily., Disp: , Rfl:    testosterone cypionate (DEPOTESTOSTERONE CYPIONATE) 200 MG/ML injection, Inject 1 mL into the skin every 14 (fourteen) days., Disp: , Rfl:   Laboratory examination:   External labs:   Labs 11/11/2021:  TSH normal at 2.140.  Vitamin D 56.  Total cholesterol 208, triglycerides 115, HDL 58, LDL 127.  Non-HDL cholesterol 150.  Hb 17.7/HCT 50.5, platelets 183.  Normal indicis.  121, creatinine 1.11, EGFR >90 mL.  Potassium 4.4.  LFTs normal.  Radiology:   CT angiogram abdomen 10/06/2021: Vascular/Lymphatic: No pathologically enlarged abdominal or pelvic lymph nodes. Normal caliber abdominal aorta.  Cardiac Studies:   Coronary calcium score 01/18/2022: LM 0 LAD 10.9 LCx  0 RCA 0 Total Agatston score 10.9.  MESA database percentile: 48. Ascending and descending thoracic aortic measurements are normal.  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  PCV ECHOCARDIOGRAM COMPLETE 12/10/2021  Left ventricle cavity is mild to moderately dilated. Normal wall thickness. Normal global wall motion. Normal LV systolic function with EF 59%. Normal diastolic filling pattern. The aortic root is dilated. measuring 3.9-4.0 cm at sinotubular junction and proximal ascending aorta. Structurally normal trileaflet aortic valve.  Mild (Grade I) aortic regurgitation. Aortic regurgitation is eccentric and could be underestimated on this transthoracic study. Consider alternative imaging if clinical suspicion for higher grade aortic regurgitation. Mild (Grade I) mitral regurgitation. Normal right atrial pressure.   EKG:   EKG 11/30/2021: Normal sinus rhythm at rate of 58 bpm, normal axis, no evidence of ischemia, normal EKG.    Assessment     ICD-10-CM   1. Moderate aortic regurgitation  I35.1 PCV ECHOCARDIOGRAM COMPLETE    2. Primary hypertension  I10 Lipid Panel With LDL/HDL Ratio    3. Hypercholesteremia  E78.00 atorvastatin (LIPITOR) 10 MG tablet       There are no discontinued medications.  Meds ordered this encounter  Medications   atorvastatin (LIPITOR) 10 MG tablet    Sig: Take 1 tablet (10 mg total) by mouth daily.    Dispense:  30 tablet    Refill:  2   Orders Placed This Encounter  Procedures   Lipid Panel With LDL/HDL Ratio   PCV ECHOCARDIOGRAM COMPLETE    Standing Status:   Future    Standing Expiration Date:   01/28/2023   Recommendations:   James Chandler is a 56 y.o.  Patient is essentially asymptomatic, exercises on a regular basis in the [facility].  He is accompanied by his wife today.  He was seen by me for evaluation of heart murmur.  Echocardiogram does confirm that eccentric moderate if not moderately  severe aortic regurgitation and LV dilatation as well.  There is no clinical evidence of heart failure and is essentially asymptomatic.  He is already on an ACE inhibitor for hypertension, continue the same.  Aortic root dilatation noted on echocardiogram is probably false, CT scan performed for coronary calcium scoring reveals normal-sized aortic root.  He will need repeat echocardiogram in a year as he is asymptomatic and has no clinical evidence of heart failure to follow-up on LV dilatation and also aortic regurgitation.  There  is no contraindication for him to continue to exercise aerobically.  No endocarditis prophylaxis is indicated.  Blood pressure is well controlled.  I discussed the coronary calcium score, he is in approximately 50th percentile.  She had decision-making regarding starting a statin therapy, CV risks include male age >42 and hypertension and occasional cigar use.  Patient will start Lipitor 10 mg daily, goal LDL <100.  Will obtain lipids in 2 months.  I will see him back in a year with repeat echocardiogram unless he develops any dyspnea, leg edema, palpitations, he is advised to follow-up with me sooner than that.  This is a 40-minute office visit encounter and discussions regarding coronary plaque, ACS, valvular heart disease and symptoms of congestive heart failure.    Adrian Prows, MD, [facility] 01/27/2022, 4:37 PM Office: [phone]

## 2022-02-01 ENCOUNTER — Other Ambulatory Visit: Payer: Self-pay | Admitting: Cardiology

## 2022-02-01 DIAGNOSIS — E7800 Pure hypercholesterolemia, unspecified: Secondary | ICD-10-CM

## 2022-05-22 LAB — LIPID PANEL WITH LDL/HDL RATIO
Cholesterol, Total: 214 mg/dL — ABNORMAL HIGH (ref 100–199)
HDL: 58 mg/dL (ref >39)
LDL Chol Calc (NIH): 138 mg/dL — ABNORMAL HIGH (ref 0–99)
LDL/HDL Ratio: 2.4 ratio (ref 0.0–3.6)
Triglycerides: 101 mg/dL (ref 0–149)
VLDL Cholesterol Cal: 18 mg/dL (ref 5–40)

## 2022-07-11 ENCOUNTER — Other Ambulatory Visit: Payer: Self-pay | Admitting: Cardiology

## 2022-07-11 DIAGNOSIS — E7800 Pure hypercholesterolemia, unspecified: Secondary | ICD-10-CM

## 2023-01-18 ENCOUNTER — Ambulatory Visit: Payer: BC Managed Care – PPO

## 2023-01-18 DIAGNOSIS — I351 Nonrheumatic aortic (valve) insufficiency: Secondary | ICD-10-CM

## 2023-01-23 NOTE — Progress Notes (Signed)
Echocardiogram 01/18/2023: Left ventricle cavity is mildly dilated. Mild concentric hypertrophy of the left ventricle. Normal global wall motion. Normal LV systolic function with EF 53%. Normal diastolic filling pattern. Calculated EF 53%. The aortic root is dilated, measuring 4.1 cm at sinus of Valsalva. Left atrial cavity is mildly dilated. Trileaflet aortic valve. Moderate (Grade III) aortic regurgitation. Mild (Grade I) mitral regurgitation. No evidence of pulmonary hypertension. Personally compared with previous study on 11/20/2021, aortic regurgitation more prominent.

## 2023-01-28 ENCOUNTER — Ambulatory Visit: Payer: BC Managed Care – PPO | Admitting: Cardiology

## 2023-01-28 ENCOUNTER — Encounter: Payer: Self-pay | Admitting: Cardiology

## 2023-01-28 VITALS — BP 149/76 | HR 69 | Resp 16 | Ht 72.0 in | Wt 210.0 lb

## 2023-01-28 DIAGNOSIS — I77810 Thoracic aortic ectasia: Secondary | ICD-10-CM

## 2023-01-28 DIAGNOSIS — I351 Nonrheumatic aortic (valve) insufficiency: Secondary | ICD-10-CM

## 2023-01-28 DIAGNOSIS — I10 Essential (primary) hypertension: Secondary | ICD-10-CM

## 2023-01-28 MED ORDER — LOSARTAN POTASSIUM 100 MG PO TABS: 100.0000 mg | ORAL_TABLET | Freq: Every day | ORAL | 3 refills | Status: DC

## 2023-01-28 NOTE — Progress Notes (Unsigned)
Primary Physician/Referring:  Delia Heady, MD  Patient ID: James Chandler, male    DOB: [birth year], 57 y.o.   MRN: [id number]  Chief Complaint  Patient presents with   Moderate aortic regurgitation   Follow-up    1 year   HPI:    James Chandler  is a 57 y.o. Caucasian male patient with hypertension, presents here for follow-up of valvular heart disease, otherwise asymptomatic.  He exercises regularly.  He tried statins for a short time and felt uncomfortable being on statin and also developed some myalgias, hence has discontinued this.  States that he has been losing weight.  Exercise and diet.  He is maintaining ideal body weight.  Continues to exercise heavily and shows me the records of his exercise activity at [facility].  Past Medical History:  Diagnosis Date   Asthma    Hypertension    Past Surgical History:  Procedure Laterality Date   CHOLECYSTECTOMY  2012   HERNIA REPAIR Bilateral 2015   SINUSOTOMY  2005   Family History  Problem Relation Age of Onset   Hypertension Mother    Stroke Mother 59   Atrial fibrillation Father    Atrial fibrillation Brother    Diverticulitis Brother    Other Brother        PFO    Social History   Tobacco Use   Smoking status: Never   Smokeless tobacco: Former    Types: Chew    Quit date: 1985  Substance Use Topics   Alcohol use: Yes    Alcohol/week: 1.0 standard drink of alcohol    Types: 1 Glasses of wine per week    Comment: occasionally   Marital Status: Married  ROS  Review of Systems  Cardiovascular:  Negative for chest pain, dyspnea on exertion and leg swelling.  Respiratory:  Negative for snoring.    Objective  Blood pressure (!) 149/76, pulse 69, resp. rate 16, height 6' (1.829 m), weight 210 lb (95.3 kg), SpO2 97%. Body mass index is 28.48 kg/m.     01/28/2023    1:33 PM 01/27/2022    3:43 PM 11/30/2021    9:40 AM  Vitals with BMI  Height 6\' 0"  6\' 0"  6\' 0"   Weight 210 lbs 220 lbs 13 oz 227 lbs 3  oz  BMI 28.47 29.94 30.81  Systolic 149 136 657  Diastolic 76 77 63  Pulse 69 64 59    Physical Exam Constitutional:      Appearance: He is well-developed.  Neck:     Vascular: No JVD.  Cardiovascular:     Rate and Rhythm: Normal rate and regular rhythm.     Pulses: Intact distal pulses.     Heart sounds: Murmur heard.     High-pitched blowing decrescendo early diastolic murmur is present with a grade of 3/4 at the upper right sternal border radiating to the apex.     No gallop.  Pulmonary:     Effort: Pulmonary effort is normal.     Breath sounds: Normal breath sounds.  Abdominal:     General: Bowel sounds are normal.     Palpations: Abdomen is soft.  Musculoskeletal:     Right lower leg: No edema.     Left lower leg: No edema.    Medications and allergies   Allergies  Allergen Reactions   Penicillins Rash    Other reaction(s): Unknown     Medication list after today's encounter   Current Outpatient Medications:  albuterol (VENTOLIN HFA) 108 (90 Base) MCG/ACT inhaler, Inhale 2 puffs into the lungs every 4 (four) hours as needed., Disp: , Rfl:    EPINEPHrine 0.3 mg/0.3 mL IJ SOAJ injection, Inject 0.3 mg into the muscle as needed., Disp: , Rfl:    escitalopram (LEXAPRO) 10 MG tablet, Take 10 mg by mouth daily., Disp: , Rfl:    levocetirizine (XYZAL) 5 MG tablet, Take 1 tablet by mouth at bedtime., Disp: , Rfl:    losartan (COZAAR) 100 MG tablet, Take 1 tablet (100 mg total) by mouth daily., Disp: 90 tablet, Rfl: 3   montelukast (SINGULAIR) 10 MG tablet, Take 1 tablet by mouth daily., Disp: , Rfl:    tadalafil (CIALIS) 5 MG tablet, Take 5 mg by mouth daily., Disp: , Rfl:    testosterone cypionate (DEPOTESTOSTERONE CYPIONATE) 200 MG/ML injection, Inject 1 mL into the skin every 14 (fourteen) days., Disp: , Rfl:   Laboratory examination:   Lab Results  Component Value Date   CHOL 214 (H) 05/21/2022   HDL 58 05/21/2022   LDLCALC 138 (H) 05/21/2022   TRIG 101  05/21/2022    External labs:   Labs 11/11/2021:  TSH normal at 2.140.  Vitamin D 56.  Total cholesterol 208, triglycerides 115, HDL 58, LDL 127.  Non-HDL cholesterol 150.  Hb 17.7/HCT 50.5, platelets 183.  Normal indicis.  121, creatinine 1.11, EGFR >90 mL.  Potassium 4.4.  LFTs normal.  Radiology:   CT angiogram abdomen 10/06/2021: Vascular/Lymphatic: No pathologically enlarged abdominal or pelvic lymph nodes. Normal caliber abdominal aorta.  Cardiac Studies:   Coronary calcium score 01/18/2022: LM 0 LAD 10.9 LCx 0 RCA 0 Total Agatston score 10.9.  MESA database percentile: 48. Ascending and descending thoracic aortic measurements are normal.  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  PCV ECHOCARDIOGRAM COMPLETE 01/18/2023  Narrative Echocardiogram 01/18/2023: Left ventricle cavity is mildly dilated. Mild concentric hypertrophy of the left ventricle. Normal global wall motion. Normal LV systolic function with EF 53%. Normal diastolic filling pattern. Calculated EF 53%. The aortic root is dilated, measuring 4.1 cm at sinus of Valsalva. Left atrial cavity is mildly dilated. Trileaflet aortic valve. Moderate (Grade III) aortic regurgitation. Mild (Grade I) mitral regurgitation. No evidence of pulmonary hypertension. Personally compared with previous study on 11/20/2021, aortic regurgitation more prominent from mild regurgitation.     EKG:   EKG 01/20/2023: Normal sinus rhythm at the rate of 65 bpm, left atrial enlargement, left axis deviation, left anterior fascicular block.  LVH.  PACs (2).  Compared to 11/30/2021, PACs, left atrial abnormality and borderline criteria for LVH is new.  Assessment     ICD-10-CM   1. Moderate aortic regurgitation  I35.1 EKG 12-Lead    losartan (COZAAR) 100 MG tablet    PCV ECHOCARDIOGRAM COMPLETE    2. Primary hypertension  I10 losartan (COZAAR) 100 MG tablet    3. Aortic root dilatation (HCC)  I77.810         Medications Discontinued During This Encounter  Medication Reason   atorvastatin (LIPITOR) 10 MG tablet    lisinopril (ZESTRIL) 20 MG tablet Change in therapy    Meds ordered this encounter  Medications   losartan (COZAAR) 100 MG tablet    Sig: Take 1 tablet (100 mg total) by mouth daily.    Dispense:  90 tablet    Refill:  3   Orders Placed This Encounter  Procedures   EKG 12-Lead   PCV ECHOCARDIOGRAM COMPLETE  Standing Status:   Future    Standing Expiration Date:   01/28/2024   Recommendations:   ARMSTRONG LOLLIS is a 57 y.o. Caucasian male patient with hypertension, very mild hypercholesterolemia, coronary calcium score in the 48th percentile on 01/18/2022, mild asymptomatic aortic regurgitation and mild ascending aortic dilatation at 4 cm approximately presents here for annual visit.  1. Moderate aortic regurgitation His aortic regurgitation is slightly progressed compared to last year from mild to the present moderate. Auscultation reveals more prominent aortic regurgitation.  No clinical evidence of heart failure and he continues to exercise very regularly without any symptoms.  I reviewed his exercise routine. - EKG 12-Lead - losartan (COZAAR) 100 MG tablet; Take 1 tablet (100 mg total) by mouth daily.  Dispense: 90 tablet; Refill: 3 - PCV ECHOCARDIOGRAM COMPLETE; Future  2. Primary hypertension Blood pressure is well-controlled, presently on lisinopril.  In view of aortic aneurysm and aortic root dilatation, outperformed to be on losartan, discontinued 20 mg lisinopril and switched him to losartan 100 mg daily.  Will continue to monitor his blood pressure closely, goal blood pressure <130/80 mmHg.  - losartan (COZAAR) 100 MG tablet; Take 1 tablet (100 mg total) by mouth daily.  Dispense: 90 tablet; Refill: 3  3. Aortic root dilatation. -Echocardiogram -Losartan  Other orders - EPINEPHrine 0.3 mg/0.3 mL IJ SOAJ injection; Inject 0.3 mg into the muscle as  needed. - escitalopram (LEXAPRO) 10 MG tablet; Take 10 mg by mouth daily.  I will see him back in a year with repeat echocardiogram unless he develops any dyspnea, leg edema, palpitations, he is advised to follow-up with me sooner than that.      Yates Decamp, MD, [facility] 01/29/2023, 2:51 PM Office: [phone]

## 2024-01-17 ENCOUNTER — Other Ambulatory Visit: Payer: Self-pay | Admitting: Cardiology

## 2024-01-17 DIAGNOSIS — I351 Nonrheumatic aortic (valve) insufficiency: Secondary | ICD-10-CM

## 2024-01-17 DIAGNOSIS — I10 Essential (primary) hypertension: Secondary | ICD-10-CM

## 2024-01-24 ENCOUNTER — Other Ambulatory Visit: Payer: BC Managed Care – PPO

## 2024-01-24 ENCOUNTER — Ambulatory Visit (HOSPITAL_COMMUNITY)
Admission: RE | Admit: 2024-01-24 | Discharge: 2024-01-24 | Disposition: A | Discharge status: Home / Self Care | Payer: BC Managed Care – PPO | Source: Ambulatory Visit | Attending: Cardiology | Admitting: Cardiology

## 2024-01-24 DIAGNOSIS — I351 Nonrheumatic aortic (valve) insufficiency: Secondary | ICD-10-CM | POA: Diagnosis present

## 2024-01-24 LAB — ECHOCARDIOGRAM COMPLETE
Area-P 1/2: 3.7 cm2
P 1/2 time: 341 ms
S' Lateral: 4.3 cm

## 2024-01-25 ENCOUNTER — Ambulatory Visit: Payer: Self-pay | Admitting: Cardiology

## 2024-01-25 NOTE — Progress Notes (Signed)
 Compared to 11/20/2021, study done on 01/23/2023 suggested mild progression of aortic regurgitation.  Present study again reveals aortic regurgitation is slightly worsened from moderate to moderately severe.  Discuss more on your office visit regarding neck steps whether we can continue to watch or you need reevaluation.

## 2024-02-02 ENCOUNTER — Ambulatory Visit: Payer: BC Managed Care – PPO | Admitting: Cardiology

## 2024-03-01 ENCOUNTER — Encounter: Payer: Self-pay | Admitting: Cardiology

## 2024-03-01 ENCOUNTER — Ambulatory Visit: Attending: Cardiology | Admitting: Cardiology

## 2024-03-01 VITALS — BP 137/68 | Resp 16 | Ht 72.0 in | Wt 213.5 lb

## 2024-03-01 DIAGNOSIS — I10 Essential (primary) hypertension: Secondary | ICD-10-CM

## 2024-03-01 DIAGNOSIS — D45 Polycythemia vera: Secondary | ICD-10-CM | POA: Diagnosis not present

## 2024-03-01 DIAGNOSIS — I351 Nonrheumatic aortic (valve) insufficiency: Secondary | ICD-10-CM

## 2024-03-01 NOTE — Patient Instructions (Signed)
 Medication Instructions:  Your physician recommends that you continue on your current medications as directed. Please refer to the Current Medication list given to you today.  *If you need a refill on your cardiac medications before your next appointment, please call your pharmacy*  Lab Work: none If you have labs (blood work) drawn today and your tests are completely normal, you will receive your results only by: MyChart Message (if you have MyChart) OR A paper copy in the mail If you have any lab test that is abnormal or we need to change your treatment, we will call you to review the results.  Testing/Procedures: Your physician has requested that you have an echocardiogram in one year. Echocardiography is a painless test that uses sound waves to create images of your heart. It provides your doctor with information about the size and shape of your heart and how well your heart's chambers and valves are working. This procedure takes approximately one hour. There are no restrictions for this procedure. Please do NOT wear cologne, perfume, aftershave, or lotions (deodorant is allowed). Please arrive 15 minutes prior to your appointment time.  Please note: We ask at that you not bring children with you during ultrasound (echo/ vascular) testing. Due to room size and safety concerns, children are not allowed in the ultrasound rooms during exams. Our front office staff cannot provide observation of children in our lobby area while testing is being conducted. An adult accompanying a patient to their appointment will only be allowed in the ultrasound room at the discretion of the ultrasound technician under special circumstances. We apologize for any inconvenience.    Follow-Up: At [facility], you and your health needs are our priority.  As part of our continuing mission to provide you with exceptional heart care, our providers are all part of one team.  This team includes your primary  Cardiologist (physician) and Advanced Practice Providers or APPs (Physician Assistants and Nurse Practitioners) who all work together to provide you with the care you need, when you need it.  Your next appointment:   12 month(s)  after echocardiogram  Provider:   Dr Ladona   We recommend signing up for the patient portal called MyChart.  Sign up information is provided on this After Visit Summary.  MyChart is used to connect with patients for Virtual Visits (Telemedicine).  Patients are able to view lab/test results, encounter notes, upcoming appointments, etc.  Non-urgent messages can be sent to your provider as well.   To learn more about what you can do with MyChart, go to [email or web address].   Other Instructions

## 2024-03-01 NOTE — Progress Notes (Signed)
 Cardiology Office Note:  .   Date:  03/01/2024  ID:  James Chandler, DOB 1965-09-06, MRN [id number] PCP: Chad Lynwood BRAVO, MD  [facility] Providers Cardiologist:  Gordy Bergamo, MD   History of Present Illness: .   James Chandler is a 58 y.o. Caucasian male patient with hypertension, moderate aortic regurgitation, mild hypercholesterolemia presents here for follow-up of valvular heart disease, otherwise asymptomatic.  Echocardiogram 01/24/2024 revealing normal LVEF, tricuspid aortic valve with moderate to severe eccentric aortic regurgitation with diastolic reversal of suprasternal notch was noted and aortic root measured mild dilatation at 41 mm.  Compared to 01/18/2023, aortic regurgitation has mildly progressed.  This is an annual visit.  He is statin intolerant.  Because of low coronary calcium  score in the 40th percentile, in the absence of diabetes and tobacco use, he is not on a statin therapy.  Cardiac Studies relevent.    Coronary calcium  score 01/18/2022: LM 0 LAD 10.9 LCx 0 RCA 0 Total Agatston score 10.9.  MESA database percentile: 48. Ascending and descending thoracic aortic measurements are normal.  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  ECHOCARDIOGRAM COMPLETE 01/24/2024  1. Left ventricular ejection fraction, by estimation, is 55 to 60%. The left ventricle has normal function. The left ventricle has no regional wall motion abnormalities. The left ventricular internal cavity size was mildly dilated. Left ventricular diastolic parameters were normal. 2. Right ventricular systolic function is normal. The right ventricular size is normal. There is normal pulmonary artery systolic pressure. 3. The mitral valve is normal in structure. Mild mitral valve regurgitation. No evidence of mitral stenosis. 4. The aortic valve is tricuspid. Aortic valve regurgitation moderate to severe, eccentric, diastolic reversal at the suprasternal notch.  Aortic valve sclerosis is present, with no evidence of aortic valve stenosis. 5. Aortic Proximal ascending aorta normal size, 39mm. There is dilatation of the aortic root, measuring 41 mm. 6. The inferior vena cava is normal in size with greater than 50% respiratory variability, suggesting right atrial pressure of 3 mmHg. Compared to 01/18/2023, aortic regurgitation has mildly progressed.      Discussed the use of AI scribe software for clinical note transcription with the patient, who gave verbal consent to proceed.  History of Present Illness James Chandler is a 58 year old male with aortic regurgitation who presents for follow-up of his heart condition.  He engages in regular exercise, attending [facility] classes, and maintains a stable heart rate during these sessions. He feels good during workouts and does not feel that his heart condition affects him. He is aware of his aortic regurgitation and continues regular monitoring. The condition has impacted his ability to obtain additional life insurance due to its cost.  He has mild cholesterol issues and has experienced statin intolerance, which caused muscle aches and joint pain. He is not currently on statins due to these side effects.  He is on testosterone supplements, and his hemoglobin levels have been high, reaching close to eighteen. He usually donates blood regularly to manage his hemoglobin levels but has not done so recently.  Labs   Care everywhere/Faxed External Labs:  Labs 12/08/2023:  Lp(a) 12.  Apolipoprotein B minimally elevated at 100 (<90).  A1c 4.4%.  Total cholesterol 224, triglycerides 186, HDL 59, LDL 133.  Non-HDL cholesterol 165.  BUN 24, creatinine 1.01, eGFR 86 mL, LFTs normal, mildly elevated serum bilirubin chronic.  Hb 17.9/HCT 49.5, platelets 217.  Serum testosterone markedly  elevated at 1436.  ROS  Review of Systems  Cardiovascular:  Negative for chest pain, dyspnea on exertion and leg  swelling.   Physical Exam:   VS:  BP 137/68 (BP Location: Left Arm, Patient Position: Sitting, Cuff Size: Large)   Resp 16   Ht 6' (1.829 m)   Wt 213 lb 8 oz (96.8 kg)   SpO2 96%   BMI 28.96 kg/m    Wt Readings from Last 3 Encounters:  03/01/24 213 lb 8 oz (96.8 kg)  01/28/23 210 lb (95.3 kg)  01/27/22 220 lb 12.8 oz (100.2 kg)    BP Readings from Last 3 Encounters:  03/01/24 137/68  01/28/23 (!) 149/76  01/27/22 136/77   Physical Exam Neck:     Vascular: No carotid bruit or JVD.  Cardiovascular:     Rate and Rhythm: Normal rate and regular rhythm.     Pulses: Intact distal pulses.     Heart sounds: Murmur heard.     High-pitched blowing early diastolic murmur is present with a grade of 2/4 at the upper right sternal border radiating to the apex.     No gallop.  Pulmonary:     Effort: Pulmonary effort is normal.     Breath sounds: Normal breath sounds.  Abdominal:     General: Bowel sounds are normal.     Palpations: Abdomen is soft.  Musculoskeletal:     Right lower leg: No edema.     Left lower leg: No edema.    EKG:         ASSESSMENT AND PLAN: .      ICD-10-CM   1. Moderate aortic regurgitation  I35.1 ECHOCARDIOGRAM COMPLETE    2. Primary hypertension  I10     3. Polycythemia vera, acquired (HCC)  D45      Assessment & Plan Aortic valve regurgitation, moderate to moderately severe Progression from moderate to moderately severe aortic valve regurgitation compared to last year. Heart is not dilated, and heart function remains stable. Asymptomatic and exercises without limitations. Valve is well compensated, and future interventions may be noninvasive if needed. - Continue annual echocardiogram monitoring - Reassure about current heart function and lack of need for intervention - Advise to continue current exercise regimen - No endocarditis prophylaxis is indicated.  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 Risk of polycythemia vera discussed, which can increase risk of stroke and myocardial infarction due to increased blood viscosity. Alternative testosterone supplements with fewer side effects are available. - Advise to discuss reducing testosterone dosage with primary care physician - Consider alternative testosterone supplements with fewer side effects  Hyperlipidemia, statin intolerant Mild hyperlipidemia with statin intolerance due to myalgia and arthralgia. Coronary calcium  score is less than 50th percentile, indicating low risk. - Recommend trying Red Yeast Rice as a natural supplement to lower cholesterol  Hypertension, well controlled Blood pressure is well controlled.   Follow up: 1 year with repeat echocardiogram for follow-up of progressive moderate to severe aortic regurgitation.  Wife present and all questions answered.  Signed,  Gordy Bergamo, MD, [facility] 03/01/2024, 9:09 PM [facility] [address] [place], [place] [address] Phone: [phone]. Fax:  [phone]

## 2024-03-02 ENCOUNTER — Other Ambulatory Visit: Payer: Self-pay | Admitting: Medical Genetics

## 2024-04-18 ENCOUNTER — Other Ambulatory Visit: Payer: Self-pay | Admitting: Cardiology

## 2024-04-18 DIAGNOSIS — I351 Nonrheumatic aortic (valve) insufficiency: Secondary | ICD-10-CM

## 2024-04-18 DIAGNOSIS — I10 Essential (primary) hypertension: Secondary | ICD-10-CM

## 2024-04-30 ENCOUNTER — Other Ambulatory Visit (HOSPITAL_COMMUNITY)

## 2024-05-11 ENCOUNTER — Other Ambulatory Visit: Payer: Self-pay | Admitting: Medical Genetics

## 2024-05-11 DIAGNOSIS — Z006 Encounter for examination for normal comparison and control in clinical research program: Secondary | ICD-10-CM

## 2025-03-01 ENCOUNTER — Other Ambulatory Visit (HOSPITAL_COMMUNITY)
